# Patient Record
Sex: Male | Born: 1980 | Race: White | Hispanic: No | Marital: Married
Health system: Midwestern US, Community
[De-identification: ages and names within clinical notes are randomized; demographics above are authoritative.]

## PROBLEM LIST (undated history)

## (undated) DIAGNOSIS — F909 Attention-deficit hyperactivity disorder, unspecified type: Secondary | ICD-10-CM

## (undated) DIAGNOSIS — Z9889 Other specified postprocedural states: Secondary | ICD-10-CM

## (undated) DIAGNOSIS — K047 Periapical abscess without sinus: Secondary | ICD-10-CM

## (undated) DIAGNOSIS — J302 Other seasonal allergic rhinitis: Secondary | ICD-10-CM

## (undated) DIAGNOSIS — G4700 Insomnia, unspecified: Secondary | ICD-10-CM

## (undated) DIAGNOSIS — R112 Nausea with vomiting, unspecified: Secondary | ICD-10-CM

## (undated) HISTORY — PX: TONSILLECTOMY: SUR1361

## (undated) HISTORY — PX: MULTIPLE TOOTH EXTRACTIONS: SHX2053

## (undated) HISTORY — PX: WISDOM TOOTH EXTRACTION: SHX21

## (undated) HISTORY — PX: HERNIA REPAIR: SHX51

---

## 2015-04-06 ENCOUNTER — Other Ambulatory Visit: Payer: Self-pay | Admitting: Orthopaedic Surgery

## 2015-04-06 DIAGNOSIS — M25511 Pain in right shoulder: Secondary | ICD-10-CM

## 2015-04-21 ENCOUNTER — Ambulatory Visit
Admission: RE | Admit: 2015-04-21 | Discharge: 2015-04-21 | Disposition: A | Discharge status: Home / Self Care | Payer: BLUE CROSS/BLUE SHIELD | Source: Ambulatory Visit | Attending: Orthopaedic Surgery | Admitting: Orthopaedic Surgery

## 2015-04-21 DIAGNOSIS — M25511 Pain in right shoulder: Secondary | ICD-10-CM

## 2015-04-21 MED ORDER — IOHEXOL 180 MG/ML  SOLN
12.0000 mL | Freq: Once | INTRAMUSCULAR | Status: AC | PRN
  Administered 2015-04-21: 12 mL via INTRA_ARTICULAR

## 2015-08-02 ENCOUNTER — Ambulatory Visit
Admission: RE | Admit: 2015-08-02 | Discharge: 2015-08-02 | Disposition: A | Discharge status: Home / Self Care | Payer: No Typology Code available for payment source | Source: Ambulatory Visit | Attending: *Deleted | Admitting: *Deleted

## 2015-08-02 ENCOUNTER — Other Ambulatory Visit: Payer: Self-pay | Admitting: *Deleted

## 2015-08-02 DIAGNOSIS — R7611 Nonspecific reaction to tuberculin skin test without active tuberculosis: Secondary | ICD-10-CM

## 2016-01-15 DIAGNOSIS — Z6823 Body mass index (BMI) 23.0-23.9, adult: Secondary | ICD-10-CM | POA: Diagnosis not present

## 2016-01-15 DIAGNOSIS — Z23 Encounter for immunization: Secondary | ICD-10-CM | POA: Diagnosis not present

## 2016-01-15 DIAGNOSIS — Z1389 Encounter for screening for other disorder: Secondary | ICD-10-CM | POA: Diagnosis not present

## 2016-01-15 DIAGNOSIS — Z0000 Encounter for general adult medical examination without abnormal findings: Secondary | ICD-10-CM | POA: Diagnosis not present

## 2016-05-23 DIAGNOSIS — J018 Other acute sinusitis: Secondary | ICD-10-CM | POA: Diagnosis not present

## 2017-01-09 DIAGNOSIS — Z0000 Encounter for general adult medical examination without abnormal findings: Secondary | ICD-10-CM | POA: Diagnosis not present

## 2017-01-16 DIAGNOSIS — Z6822 Body mass index (BMI) 22.0-22.9, adult: Secondary | ICD-10-CM | POA: Diagnosis not present

## 2017-01-16 DIAGNOSIS — Z0000 Encounter for general adult medical examination without abnormal findings: Secondary | ICD-10-CM | POA: Diagnosis not present

## 2017-01-16 DIAGNOSIS — Z1389 Encounter for screening for other disorder: Secondary | ICD-10-CM | POA: Diagnosis not present

## 2017-06-20 DIAGNOSIS — J069 Acute upper respiratory infection, unspecified: Secondary | ICD-10-CM | POA: Diagnosis not present

## 2017-06-20 DIAGNOSIS — Z6823 Body mass index (BMI) 23.0-23.9, adult: Secondary | ICD-10-CM | POA: Diagnosis not present

## 2017-06-20 DIAGNOSIS — R05 Cough: Secondary | ICD-10-CM | POA: Diagnosis not present

## 2017-07-01 ENCOUNTER — Ambulatory Visit (INDEPENDENT_AMBULATORY_CARE_PROVIDER_SITE_OTHER): Payer: BLUE CROSS/BLUE SHIELD | Admitting: Orthopaedic Surgery

## 2017-07-01 ENCOUNTER — Encounter (INDEPENDENT_AMBULATORY_CARE_PROVIDER_SITE_OTHER): Payer: Self-pay | Admitting: Orthopaedic Surgery

## 2017-07-01 DIAGNOSIS — S43431D Superior glenoid labrum lesion of right shoulder, subsequent encounter: Secondary | ICD-10-CM

## 2017-07-01 NOTE — Progress Notes (Signed)
   Office Visit Note   Patient: Daniel Branch           Date of Birth: 06-12-1980           MRN: [id number] Visit Date: 07/01/2017              Requested by: No referring provider defined for this encounter. PCP: No primary care provider on file.   Assessment & Plan: Visit Diagnoses:  1. Labral tear of shoulder, right, subsequent encounter     Plan: MRI findings and the images from 2 years ago which does show a large lesion that extends into the inferior and posterior labrum.  Given the size of the labral tear and continued symptoms I recommend surgical treatment with Dr. August Saucerean.  Patient is in.  He will make an appointment with Dr. August Saucerean ASAP.  Follow-Up Instructions: Return for Dr. August Saucerean ASAP.   Orders:  No orders of the defined types were placed in this encounter.  No orders of the defined types were placed in this encounter.     Procedures: No procedures performed   Clinical Data: No additional findings.   Subjective: Chief Complaint  Patient presents with  . Right Shoulder - Pain, Follow-up    Daniel Branch comes in today for follow-up of his right shoulder labral tear.  I saw him 2 years ago for this issue.  He is trying to modify his activity but he continues to have pain and instability with his right shoulder.  He felt a recent subluxation moment when he had his arm in external rotation and abduction.  He is a Education officer, environmentalpastor at [employer] and he did have a brief struggle with substance abuse but now he is clean.    Review of Systems  Constitutional: Negative.   All other systems reviewed and are negative.    Objective: Vital Signs: There were no vitals taken for this visit.  Physical Exam  Constitutional: He is oriented to person, place, and time. He appears well-developed and well-nourished.  Pulmonary/Chest: Effort normal.  Abdominal: Soft.  Neurological: He is alert and oriented to person, place, and time.  Skin: Skin is warm.  Psychiatric: He has a normal mood  and affect. His behavior is normal. Judgment and thought content normal.  Nursing note and vitals reviewed.   Ortho Exam Right shoulder exam shows normal range of motion.  He does have a positive apprehension sign.  Rotator cuff is grossly intact.  He does have pain with loading of the posterior labrum. Specialty Comments:  No specialty comments available.  Imaging: No results found.   PMFS History: There are no active problems to display for this patient.  History reviewed. No pertinent past medical history.  History reviewed. No pertinent family history.  History reviewed. No pertinent surgical history. Social History   Occupational History  . Not on file  Tobacco Use  . Smoking status: Never Smoker  . Smokeless tobacco: Never Used  Substance and Sexual Activity  . Alcohol use: Not on file  . Drug use: Not on file  . Sexual activity: Not on file

## 2017-07-07 ENCOUNTER — Encounter (INDEPENDENT_AMBULATORY_CARE_PROVIDER_SITE_OTHER): Payer: Self-pay | Admitting: Orthopedic Surgery

## 2017-07-07 ENCOUNTER — Ambulatory Visit (INDEPENDENT_AMBULATORY_CARE_PROVIDER_SITE_OTHER): Payer: BLUE CROSS/BLUE SHIELD | Admitting: Orthopedic Surgery

## 2017-07-07 DIAGNOSIS — S43431D Superior glenoid labrum lesion of right shoulder, subsequent encounter: Secondary | ICD-10-CM | POA: Diagnosis not present

## 2017-07-07 NOTE — Progress Notes (Signed)
Office Visit Note   Patient: Daniel Branch           Date of Birth: November 10, 1980           MRN: [id number] Visit Date: 07/07/2017 Requested by: No referring provider defined for this encounter. PCP: Geoffry Paradise, MD  Subjective: Chief Complaint  Patient presents with  . Right Shoulder - Pain    HPI: Bhavesh is a patient with right shoulder pain and instability.  First episode of right shoulder instability occurred when he was playing ice hockey 15 years ago.  Shoulder slipped out and then went back again.  He has never had to go to the emergency room to have reduction performed.  This happens about 6 times a year.  Most recently it happened when he was sailing on a boat and an abduction external rotation force because the arm to slip out of socket again at the shoulder joint.  Is hard for him to sleep at times.  He has a 78-year-old son and a wife who will be giving birth due in August of this year.  He is left-hand dominant.  Notably the patient has also undergone treatment for substance abuse.  This has not related to opioids but has related to benzodiazepines and stimulants such as Adderall.  He did take opioids for hernia surgery and had no issues.              ROS: All systems reviewed are negative as they relate to the chief complaint within the history of present illness.  Patient denies  fevers or chills.   Assessment & Plan: Visit Diagnoses:  1. Labral tear of shoulder, right, subsequent encounter     Plan: Impression is anterior inferior labral tear of the right shoulder with current symptomatic instability.  Patient has social issues regarding trying to get this done before his wife gives birth to their second child.  He has had recurrent instability.  He does not have global laxity on examination.  He does not do very much in terms of overhead recreational activities with his nondominant right arm.  Plan is arthroscopy with arthroscopic labral repair of the anterior inferior  labrum.  Risk benefits are discussed including the trade off between losing some shoulder range of motion and gaining stability.  Patient's MRI scan is reviewed with the patient.  2017.  All questions answered.  Follow-Up Instructions: No follow-ups on file.   Orders:  No orders of the defined types were placed in this encounter.  No orders of the defined types were placed in this encounter.     Procedures: No procedures performed   Clinical Data: No additional findings.  Objective: Vital Signs: There were no vitals taken for this visit.  Physical Exam:   Constitutional: Patient appears well-developed HEENT:  Head: Normocephalic Eyes:EOM are normal Neck: Normal range of motion Cardiovascular: Normal rate Pulmonary/chest: Effort normal Neurologic: Patient is alert Skin: Skin is warm Psychiatric: Patient has normal mood and affect    Ortho Exam: Orthopedic exam demonstrates apprehension anteriorly on the right compared to the left.  Less than a centimeter sulcus sign bilaterally.  Rotator cuff strength intact bilaterally.  Negative O'Brien's testing on the right.  Does have more anterior laxity and posterior laxity on the right shoulder.  Patient's dislocation events almost always occur with abduction and external rotation.  Patient has no real instability with crossarm adduction and posterior directed force.  Specialty Comments:  No specialty comments available.  Imaging: No results found.  PMFS History: There are no active problems to display for this patient.  History reviewed. No pertinent past medical history.  History reviewed. No pertinent family history.  History reviewed. No pertinent surgical history. Social History   Occupational History  . Not on file  Tobacco Use  . Smoking status: Never Smoker  . Smokeless tobacco: Never Used  Substance and Sexual Activity  . Alcohol use: Not on file  . Drug use: Not on file  . Sexual activity: Not on file

## 2017-07-11 ENCOUNTER — Other Ambulatory Visit: Payer: Self-pay

## 2017-07-11 ENCOUNTER — Other Ambulatory Visit (INDEPENDENT_AMBULATORY_CARE_PROVIDER_SITE_OTHER): Payer: Self-pay | Admitting: Orthopedic Surgery

## 2017-07-11 ENCOUNTER — Encounter (HOSPITAL_COMMUNITY): Payer: Self-pay | Admitting: *Deleted

## 2017-07-11 DIAGNOSIS — S43431D Superior glenoid labrum lesion of right shoulder, subsequent encounter: Secondary | ICD-10-CM

## 2017-07-14 NOTE — H&P (Signed)
Daniel Branch is an 37 y.o. male.   Chief Complaint: Right shoulder instability HPI: Daniel Branch is a 37 year old patient with right shoulder instability.  Last event happened about a month ago when he was on a boat and he was in the abducted externally rotated position and the shoulder slipped out of joint.  Subsequent MRI scanning demonstrates anterior inferior labral tearing.  He was scheduled to have this fixed about 2 years ago but something came up and he had to postpone it.  He reports continued episodes of instability in the right shoulder with overhead types of activities.  His wife is due with their second child in August and he would like to have this addressed and resolved hopefully by the time the second child comes.  He does have a history of having some issues with stimulants and clonazepam type medication.  He has no history of opioid addiction.  Past Medical History:  Diagnosis Date  . ADHD   . Dental infection    treated with antibiotic  . Insomnia   . PONV (postoperative nausea and vomiting)   . Seasonal allergies     Past Surgical History:  Procedure Laterality Date  . HERNIA REPAIR    . MULTIPLE TOOTH EXTRACTIONS    . TONSILLECTOMY    . WISDOM TOOTH EXTRACTION      No family history on file. Social History:  reports that he has never smoked. He has never used smokeless tobacco. He reports that he drank alcohol. He reports that he does not use drugs.  Allergies:  Allergies  Allergen Reactions  . Cefaclor Hives    No medications prior to admission.    No results found for this or any previous visit (from the past 48 hour(s)). No results found.  Review of Systems  Musculoskeletal: Positive for joint pain.  All other systems reviewed and are negative.   There were no vitals taken for this visit. Physical Exam  Constitutional: He appears well-developed.  HENT:  Head: Normocephalic.  Eyes: Pupils are equal, round, and reactive to light.  Neck: Normal range  of motion.  Cardiovascular: Normal rate.  Respiratory: Effort normal.  Neurological: He is alert.  Skin: Skin is warm.  Psychiatric: He has a normal mood and affect.  Examination of the right shoulder demonstrates good rotator cuff strength and range of motion.  Does have a little bit of anterior apprehension with abduction and external rotation on the right which is not present on the left.  I do not detect any coarse grinding or crepitus with labral load testing.  Less than a centimeter sulcus sign bilaterally.  O'Brien's testing negative on the right negative on the left.  No discrete AC joint tenderness on the right-hand side.  Cervical spine range of motion is full.  Motor sensory function of the hand intact.  Assessment/Plan Impression is right shoulder instability of long duration.  MRI scan is consistent with anterior inferior labral tearing.  Fairly subtle but he has had recurrent episodes of instability.  Has a wife is giving birth to second child in August.  She would like to get this resolved before then.  I think based on his current activity level which does not include much in terms of overhead activities that he would do reasonably well with an arthroscopic stabilization procedure.  The risk and benefits are discussed including but not limited to infection nerve vessel damage as well as shoulder stiffness and incomplete restoration of full motion.  Patient understands risk  benefits and wishes to proceed.  All questions answered  Burnard Bunting, MD 07/14/2017, 3:22 PM

## 2017-07-15 ENCOUNTER — Encounter (INDEPENDENT_AMBULATORY_CARE_PROVIDER_SITE_OTHER): Payer: Self-pay | Admitting: Orthopedic Surgery

## 2017-07-15 ENCOUNTER — Telehealth (INDEPENDENT_AMBULATORY_CARE_PROVIDER_SITE_OTHER): Payer: Self-pay | Admitting: Orthopedic Surgery

## 2017-07-15 ENCOUNTER — Encounter (HOSPITAL_COMMUNITY)
Admission: RE | Disposition: A | Discharge status: Home / Self Care | Payer: Self-pay | Source: Ambulatory Visit | Attending: Orthopedic Surgery

## 2017-07-15 ENCOUNTER — Ambulatory Visit (HOSPITAL_COMMUNITY): Payer: BLUE CROSS/BLUE SHIELD | Admitting: Certified Registered Nurse Anesthetist

## 2017-07-15 ENCOUNTER — Encounter (HOSPITAL_COMMUNITY): Payer: Self-pay | Admitting: Certified Registered Nurse Anesthetist

## 2017-07-15 ENCOUNTER — Ambulatory Visit (HOSPITAL_COMMUNITY)
Admission: RE | Admit: 2017-07-15 | Discharge: 2017-07-15 | Disposition: A | Discharge status: Home / Self Care | Payer: BLUE CROSS/BLUE SHIELD | Source: Ambulatory Visit | Attending: Orthopedic Surgery | Admitting: Orthopedic Surgery

## 2017-07-15 DIAGNOSIS — Z888 Allergy status to other drugs, medicaments and biological substances status: Secondary | ICD-10-CM | POA: Diagnosis not present

## 2017-07-15 DIAGNOSIS — G8918 Other acute postprocedural pain: Secondary | ICD-10-CM | POA: Diagnosis not present

## 2017-07-15 DIAGNOSIS — S43431D Superior glenoid labrum lesion of right shoulder, subsequent encounter: Secondary | ICD-10-CM

## 2017-07-15 DIAGNOSIS — F909 Attention-deficit hyperactivity disorder, unspecified type: Secondary | ICD-10-CM | POA: Diagnosis not present

## 2017-07-15 DIAGNOSIS — S43431A Superior glenoid labrum lesion of right shoulder, initial encounter: Secondary | ICD-10-CM | POA: Diagnosis not present

## 2017-07-15 DIAGNOSIS — M25311 Other instability, right shoulder: Secondary | ICD-10-CM | POA: Insufficient documentation

## 2017-07-15 HISTORY — DX: Nausea with vomiting, unspecified: R11.2

## 2017-07-15 HISTORY — DX: Insomnia, unspecified: G47.00

## 2017-07-15 HISTORY — DX: Attention-deficit hyperactivity disorder, unspecified type: F90.9

## 2017-07-15 HISTORY — PX: SHOULDER ARTHROSCOPY WITH LABRAL REPAIR: SHX5691

## 2017-07-15 HISTORY — DX: Periapical abscess without sinus: K04.7

## 2017-07-15 HISTORY — DX: Other seasonal allergic rhinitis: J30.2

## 2017-07-15 HISTORY — DX: Other specified postprocedural states: Z98.890

## 2017-07-15 LAB — BASIC METABOLIC PANEL
ANION GAP: 7 (ref 5–15)
BUN: 18 mg/dL (ref 6–20)
CALCIUM: 9.5 mg/dL (ref 8.9–10.3)
CHLORIDE: 105 mmol/L (ref 101–111)
CO2: 25 mmol/L (ref 22–32)
Creatinine, Ser: 0.88 mg/dL (ref 0.61–1.24)
GFR calc non Af Amer: >60 mL/min (ref >60)
Glucose, Bld: 77 mg/dL (ref 65–99)
POTASSIUM: 3.7 mmol/L (ref 3.5–5.1)
Sodium: 137 mmol/L (ref 135–145)

## 2017-07-15 LAB — CBC
HEMATOCRIT: 44.8 % (ref 39.0–52.0)
HEMOGLOBIN: 15.1 g/dL (ref 13.0–17.0)
MCH: 31.2 pg (ref 26.0–34.0)
MCHC: 33.7 g/dL (ref 30.0–36.0)
MCV: 92.6 fL (ref 78.0–100.0)
Platelets: 215 10*3/uL (ref 150–400)
RBC: 4.84 MIL/uL (ref 4.22–5.81)
RDW: 12.8 % (ref 11.5–15.5)
WBC: 9.3 10*3/uL (ref 4.0–10.5)

## 2017-07-15 SURGERY — ARTHROSCOPY, SHOULDER, WITH GLENOID LABRUM REPAIR
Anesthesia: General | Laterality: Right

## 2017-07-15 MED ORDER — FENTANYL CITRATE (PF) 250 MCG/5ML IJ SOLN
INTRAMUSCULAR | Status: DC | PRN
  Administered 2017-07-15 (×2): 50 ug via INTRAVENOUS
  Administered 2017-07-15: 150 ug via INTRAVENOUS

## 2017-07-15 MED ORDER — LACTATED RINGERS IV SOLN
INTRAVENOUS | Status: DC | PRN
  Administered 2017-07-15 (×2): via INTRAVENOUS

## 2017-07-15 MED ORDER — DEXAMETHASONE SODIUM PHOSPHATE 10 MG/ML IJ SOLN
INTRAMUSCULAR | Status: AC
  Filled 2017-07-15: qty 1

## 2017-07-15 MED ORDER — KETOROLAC TROMETHAMINE 30 MG/ML IJ SOLN
INTRAMUSCULAR | Status: AC
  Filled 2017-07-15: qty 1

## 2017-07-15 MED ORDER — SODIUM CHLORIDE 0.9 % IJ SOLN
INTRAMUSCULAR | Status: DC | PRN
  Administered 2017-07-15: 50 mL

## 2017-07-15 MED ORDER — EPINEPHRINE PF 1 MG/ML IJ SOLN
INTRAMUSCULAR | Status: DC | PRN
  Administered 2017-07-15: 2.1 mL

## 2017-07-15 MED ORDER — FENTANYL CITRATE (PF) 100 MCG/2ML IJ SOLN
INTRAMUSCULAR | Status: AC
  Administered 2017-07-15: 100 ug via INTRAVENOUS
  Filled 2017-07-15: qty 2

## 2017-07-15 MED ORDER — FENTANYL CITRATE (PF) 100 MCG/2ML IJ SOLN
100.0000 ug | Freq: Once | INTRAMUSCULAR | Status: AC
  Administered 2017-07-15: 100 ug via INTRAVENOUS

## 2017-07-15 MED ORDER — MIDAZOLAM HCL 2 MG/2ML IJ SOLN
INTRAMUSCULAR | Status: AC
  Filled 2017-07-15: qty 2

## 2017-07-15 MED ORDER — LIDOCAINE HCL (CARDIAC) 20 MG/ML IV SOLN
INTRAVENOUS | Status: AC
  Filled 2017-07-15: qty 5

## 2017-07-15 MED ORDER — HYDROMORPHONE HCL 1 MG/ML IJ SOLN
INTRAMUSCULAR | Status: AC
  Administered 2017-07-15: 0.5 mg via INTRAVENOUS
  Filled 2017-07-15: qty 1

## 2017-07-15 MED ORDER — MIDAZOLAM HCL 5 MG/5ML IJ SOLN
INTRAMUSCULAR | Status: DC | PRN
  Administered 2017-07-15: 1 mg via INTRAVENOUS

## 2017-07-15 MED ORDER — PROPOFOL 10 MG/ML IV BOLUS
INTRAVENOUS | Status: DC | PRN
  Administered 2017-07-15: 110 mg via INTRAVENOUS

## 2017-07-15 MED ORDER — ROCURONIUM BROMIDE 10 MG/ML (PF) SYRINGE
PREFILLED_SYRINGE | INTRAVENOUS | Status: DC | PRN
  Administered 2017-07-15: 50 mg via INTRAVENOUS

## 2017-07-15 MED ORDER — SCOPOLAMINE 1 MG/3DAYS TD PT72
MEDICATED_PATCH | TRANSDERMAL | Status: DC | PRN
  Administered 2017-07-15: 1 via TRANSDERMAL

## 2017-07-15 MED ORDER — LIDOCAINE 2% (20 MG/ML) 5 ML SYRINGE
INTRAMUSCULAR | Status: DC | PRN
  Administered 2017-07-15: 100 mg via INTRAVENOUS

## 2017-07-15 MED ORDER — CHLORHEXIDINE GLUCONATE 4 % EX LIQD: 60.0000 mL | Freq: Once | CUTANEOUS | Status: DC

## 2017-07-15 MED ORDER — HYDROMORPHONE HCL 1 MG/ML IJ SOLN
0.2500 mg | INTRAMUSCULAR | Status: DC | PRN
  Administered 2017-07-15: 0.5 mg via INTRAVENOUS

## 2017-07-15 MED ORDER — MIDAZOLAM HCL 2 MG/2ML IJ SOLN
INTRAMUSCULAR | Status: AC
  Administered 2017-07-15: 2 mg via INTRAVENOUS
  Filled 2017-07-15: qty 2

## 2017-07-15 MED ORDER — KETOROLAC TROMETHAMINE 30 MG/ML IJ SOLN
INTRAMUSCULAR | Status: DC | PRN
  Administered 2017-07-15: 30 mg via INTRAVENOUS

## 2017-07-15 MED ORDER — SCOPOLAMINE 1 MG/3DAYS TD PT72
MEDICATED_PATCH | TRANSDERMAL | Status: AC
  Filled 2017-07-15: qty 1

## 2017-07-15 MED ORDER — ONDANSETRON HCL 4 MG/2ML IJ SOLN
INTRAMUSCULAR | Status: AC
  Filled 2017-07-15: qty 2

## 2017-07-15 MED ORDER — ONDANSETRON HCL 4 MG/2ML IJ SOLN
INTRAMUSCULAR | Status: DC | PRN
  Administered 2017-07-15: 4 mg via INTRAVENOUS

## 2017-07-15 MED ORDER — CLINDAMYCIN PHOSPHATE 900 MG/50ML IV SOLN
900.0000 mg | INTRAVENOUS | Status: AC
  Administered 2017-07-15: 900 mg via INTRAVENOUS

## 2017-07-15 MED ORDER — ROCURONIUM BROMIDE 10 MG/ML (PF) SYRINGE
PREFILLED_SYRINGE | INTRAVENOUS | Status: AC
  Filled 2017-07-15: qty 5

## 2017-07-15 MED ORDER — SUGAMMADEX SODIUM 200 MG/2ML IV SOLN
INTRAVENOUS | Status: DC | PRN
  Administered 2017-07-15: 200 mg via INTRAVENOUS

## 2017-07-15 MED ORDER — MIDAZOLAM HCL 2 MG/2ML IJ SOLN
2.0000 mg | Freq: Once | INTRAMUSCULAR | Status: AC
Start: 2017-07-15 — End: 2017-07-15
  Administered 2017-07-15: 2 mg via INTRAVENOUS

## 2017-07-15 MED ORDER — CLINDAMYCIN PHOSPHATE 900 MG/50ML IV SOLN
INTRAVENOUS | Status: AC
  Filled 2017-07-15: qty 50

## 2017-07-15 MED ORDER — SODIUM CHLORIDE 0.9 % IR SOLN
Status: DC | PRN
  Administered 2017-07-15: 6000 mL

## 2017-07-15 MED ORDER — BUPIVACAINE-EPINEPHRINE (PF) 0.5% -1:200000 IJ SOLN
INTRAMUSCULAR | Status: DC | PRN
  Administered 2017-07-15: 30 mL via PERINEURAL

## 2017-07-15 MED ORDER — DEXAMETHASONE SODIUM PHOSPHATE 10 MG/ML IJ SOLN
INTRAMUSCULAR | Status: DC | PRN
  Administered 2017-07-15: 5 mg via INTRAVENOUS

## 2017-07-15 MED ORDER — LACTATED RINGERS IV SOLN
INTRAVENOUS | Status: DC
  Administered 2017-07-15: 10:00:00 via INTRAVENOUS

## 2017-07-15 MED ORDER — SUGAMMADEX SODIUM 200 MG/2ML IV SOLN
INTRAVENOUS | Status: AC
  Filled 2017-07-15: qty 2

## 2017-07-15 MED ORDER — FENTANYL CITRATE (PF) 250 MCG/5ML IJ SOLN
INTRAMUSCULAR | Status: AC
Start: 2017-07-15 — End: 2017-07-15
  Filled 2017-07-15: qty 5

## 2017-07-15 MED ORDER — PROPOFOL 10 MG/ML IV BOLUS
INTRAVENOUS | Status: AC
  Filled 2017-07-15: qty 20

## 2017-07-15 MED ORDER — MEPERIDINE HCL 50 MG/ML IJ SOLN: 6.2500 mg | INTRAMUSCULAR | Status: DC | PRN

## 2017-07-15 MED ORDER — ONDANSETRON HCL 4 MG/2ML IJ SOLN
4.0000 mg | Freq: Once | INTRAMUSCULAR | Status: AC | PRN
  Administered 2017-07-15: 4 mg via INTRAVENOUS

## 2017-07-15 SURGICAL SUPPLY — 51 items
ANCHOR SUT BIOCOMP LK 2.9X12.5 (Anchor) ×8 IMPLANT
BLADE CUTTER GATOR 3.5 (BLADE) ×2 IMPLANT
BLADE GREAT WHITE 4.2 (BLADE) ×2 IMPLANT
BLADE SURG 11 STRL SS (BLADE) ×2 IMPLANT
CANNULA 5.75X71 LONG (CANNULA) ×2 IMPLANT
CANNULA TWIST IN 8.25X7CM (CANNULA) ×4 IMPLANT
COVER SURGICAL LIGHT HANDLE (MISCELLANEOUS) ×2 IMPLANT
DRAPE INCISE IOBAN 66X45 STRL (DRAPES) ×2 IMPLANT
DRAPE STERI 35X30 U-POUCH (DRAPES) ×4 IMPLANT
DRSG TEGADERM 4X4.75 (GAUZE/BANDAGES/DRESSINGS) ×2 IMPLANT
DURAPREP 26ML APPLICATOR (WOUND CARE) ×2 IMPLANT
ELECT REM PT RETURN 9FT ADLT (ELECTROSURGICAL) ×2
ELECTRODE REM PT RTRN 9FT ADLT (ELECTROSURGICAL) ×1 IMPLANT
FIBERSTICK 2 (SUTURE) IMPLANT
GAUZE SPONGE 4X4 12PLY STRL (GAUZE/BANDAGES/DRESSINGS) ×2 IMPLANT
GAUZE SPONGE 4X4 12PLY STRL LF (GAUZE/BANDAGES/DRESSINGS) ×2 IMPLANT
GAUZE XEROFORM 1X8 LF (GAUZE/BANDAGES/DRESSINGS) ×2 IMPLANT
GLOVE BIOGEL PI IND STRL 8 (GLOVE) ×1 IMPLANT
GLOVE BIOGEL PI INDICATOR 8 (GLOVE) ×1
GLOVE SURG ORTHO 8.0 STRL STRW (GLOVE) ×2 IMPLANT
GOWN STRL REUS W/ TWL LRG LVL3 (GOWN DISPOSABLE) ×3 IMPLANT
GOWN STRL REUS W/TWL LRG LVL3 (GOWN DISPOSABLE) ×3
KIT BASIN OR (CUSTOM PROCEDURE TRAY) ×2 IMPLANT
KIT DISPOSABLE PUSHLOCK 2.9MM (KITS) IMPLANT
KIT PUSHLOCK 2.9 HIP (KITS) ×2 IMPLANT
KIT TURNOVER KIT B (KITS) ×2 IMPLANT
LASSO 90 CVE QUICKPAS (DISPOSABLE) ×2 IMPLANT
LASSO CRESCENT QUICKPASS (SUTURE) IMPLANT
MANIFOLD NEPTUNE II (INSTRUMENTS) ×2 IMPLANT
NEEDLE SPNL 18GX3.5 QUINCKE PK (NEEDLE) ×2 IMPLANT
NEEDLE SUT 2-0 SCORPION KNEE (NEEDLE) ×2 IMPLANT
NS IRRIG 1000ML POUR BTL (IV SOLUTION) ×2 IMPLANT
PACK SHOULDER (CUSTOM PROCEDURE TRAY) ×2 IMPLANT
PAD ARMBOARD 7.5X6 YLW CONV (MISCELLANEOUS) ×4 IMPLANT
SET ARTHROSCOPY TUBING (MISCELLANEOUS) ×1
SET ARTHROSCOPY TUBING LN (MISCELLANEOUS) ×1 IMPLANT
SLING ARM IMMOBILIZER LRG (SOFTGOODS) ×2 IMPLANT
SLING ARM IMMOBILIZER MED (SOFTGOODS) IMPLANT
SPONGE LAP 4X18 X RAY DECT (DISPOSABLE) ×2 IMPLANT
SUT ETHILON 3 0 PS 1 (SUTURE) ×4 IMPLANT
SUT FIBERWIRE 2-0 18 17.9 3/8 (SUTURE)
SUT VIC AB 3-0 X1 27 (SUTURE) ×2 IMPLANT
SUTURE FIBERWR 2-0 18 17.9 3/8 (SUTURE) IMPLANT
SYR 20CC LL (SYRINGE) ×2 IMPLANT
SYR 30ML LL (SYRINGE) ×2 IMPLANT
TAPE LABRALWHITE 1.5X36 (TAPE) ×4 IMPLANT
TAPE SUT LABRALTAP WHT/BLK (SUTURE) ×4 IMPLANT
TOWEL OR 17X24 6PK STRL BLUE (TOWEL DISPOSABLE) ×2 IMPLANT
TOWEL OR 17X26 10 PK STRL BLUE (TOWEL DISPOSABLE) ×2 IMPLANT
WAND HAND CNTRL MULTIVAC 90 (MISCELLANEOUS) IMPLANT
WATER STERILE IRR 1000ML POUR (IV SOLUTION) ×2 IMPLANT

## 2017-07-15 NOTE — Transfer of Care (Signed)
Immediate Anesthesia Transfer of Care Note  Patient: Daniel Branch M Bungert  Procedure(s) Performed: RIGHT SHOULDER ARTHROSCOPY WITH LABRAL REPAIR (Right )  Patient Location: PACU  Anesthesia Type:General and Regional  Level of Consciousness: awake, alert  and oriented  Airway & Oxygen Therapy: Patient Spontanous Breathing  Post-op Assessment: Report given to RN, Post -op Vital signs reviewed and stable and Patient moving all extremities X 4  Post vital signs: Reviewed and stable  Last Vitals:  Vitals Value Taken Time  BP    Temp    Pulse 72 07/15/2017  1:10 PM  Resp 18 07/15/2017  1:10 PM  SpO2 98 % 07/15/2017  1:10 PM  Vitals shown include unvalidated device data.  Last Pain:  Vitals:   07/15/17 1025  PainSc: 0-No pain      Patients Stated Pain Goal: 1 (07/15/17 0947)  Complications: No apparent anesthesia complications

## 2017-07-15 NOTE — Telephone Encounter (Signed)
[facility] called needing to verify strength of Tramadol, rx written today. She always wanted to verify that the quantity was for 15. CB # [phone]

## 2017-07-15 NOTE — Interval H&P Note (Signed)
History and Physical Interval Note:  07/15/2017 9:57 AM  Daniel ShieldsNathan M Boyde  has presented today for surgery, with the diagnosis of Right Shoulder Labral Tear  The various methods of treatment have been discussed with the patient and family. After consideration of risks, benefits and other options for treatment, the patient has consented to  Procedure(s): RIGHT SHOULDER ARTHROSCOPY WITH LABRAL REPAIR (Right) as a surgical intervention .  The patient's history has been reviewed, patient examined, no change in status, stable for surgery.  I have reviewed the patient's chart and labs.  Questions were answered to the patient's satisfaction.     Burnard BuntingG Scott Jancie Kercher

## 2017-07-15 NOTE — Brief Op Note (Signed)
07/15/2017  1:12 PM  PATIENT:  Daniel Branch  37 y.o. male  PRE-OPERATIVE DIAGNOSIS:  Right Shoulder Labral Tear  POST-OPERATIVE DIAGNOSIS:  Right Shoulder Labral Tear  PROCEDURE:  Procedure(s): RIGHT SHOULDER ARTHROSCOPY WITH LABRAL REPAIR  SURGEON:  Surgeon(s): August Saucerean, Corrie MckusickGregory Scott, MD  ASSISTANT: Patrick Jupiterarla Bethune rnfa  ANESTHESIA:   general  EBL: 2 ml    Total I/O In: 1200 [I.V.:1200] Out: 20 [Blood:20]  BLOOD ADMINISTERED: none  DRAINS: none   LOCAL MEDICATIONS USED:  none  SPECIMEN:  No Specimen  COUNTS:  YES  TOURNIQUET:  * No tourniquets in log *  DICTATION: .Other Dictation: Dictation Number [id number]  PLAN OF CARE: Discharge to home after PACU  PATIENT DISPOSITION:  PACU - hemodynamically stable

## 2017-07-15 NOTE — Telephone Encounter (Signed)
Please advise. I do not see medication in patients chart. Thanks.

## 2017-07-15 NOTE — Anesthesia Preprocedure Evaluation (Addendum)
Anesthesia Evaluation  Patient identified by MRN, date of birth, ID band Patient awake    Reviewed: Allergy & Precautions, NPO status , Patient's Chart, lab work & pertinent test results  History of Anesthesia Complications (+) PONV  Airway Mallampati: I  TM Distance: >3 FB Neck ROM: Full    Dental  (+) Teeth Intact, Dental Advisory Given   Pulmonary    Pulmonary exam normal        Cardiovascular Normal cardiovascular exam     Neuro/Psych    GI/Hepatic   Endo/Other    Renal/GU      Musculoskeletal   Abdominal   Peds  Hematology   Anesthesia Other Findings   Reproductive/Obstetrics                            Anesthesia Physical Anesthesia Plan  ASA: II  Anesthesia Plan: General   Post-op Pain Management:  Regional for Post-op pain   Induction: Intravenous  PONV Risk Score and Plan: 2 and Ondansetron, Dexamethasone and Midazolam  Airway Management Planned: Oral ETT  Additional Equipment:   Intra-op Plan:   Post-operative Plan: Extubation in OR  Informed Consent: I have reviewed the patients History and Physical, chart, labs and discussed the procedure including the risks, benefits and alternatives for the proposed anesthesia with the patient or authorized representative who has indicated his/her understanding and acceptance.     Plan Discussed with: CRNA and Surgeon  Anesthesia Plan Comments:         Anesthesia Quick Evaluation

## 2017-07-15 NOTE — Anesthesia Procedure Notes (Signed)
Anesthesia Regional Block: Interscalene brachial plexus block   Pre-Anesthetic Checklist: ,, timeout performed, Correct Patient, Correct Site, Correct Laterality, Correct Procedure, Correct Position, site marked, Risks and benefits discussed,  Surgical consent,  Pre-op evaluation,  At surgeon's request and post-op pain management  Laterality: Right  Prep: chloraprep       Needles:  Injection technique: Single-shot  Needle Type: Other     Needle Length: 9cm  Needle Gauge: 21   Needle insertion depth: 5 cm   Additional Needles:   Procedures:, nerve stimulator,,,,,,,  Narrative:  Start time: 07/15/2017 10:12 AM End time: 07/15/2017 10:22 AM Injection made incrementally with aspirations every 5 mL.  Performed by: Personally  Anesthesiologist: Arta Brucessey, Kashmir Lysaght, MD  Additional Notes: Monitors applied. Patient sedated. Sterile prep and drape,hand hygiene and sterile gloves were used. Needle position confirmed with evoked response at 0.4 mV.Local anesthetic injected incrementally after negative aspiration.Vascular puncture avoided. No complications. The patient tolerated the procedure well.

## 2017-07-15 NOTE — Anesthesia Procedure Notes (Signed)
Procedure Name: Intubation Date/Time: 07/15/2017 10:54 AM Performed by: Nils PyleBell, Matraca Hunkins T, CRNA Pre-anesthesia Checklist: Patient identified, Emergency Drugs available, Suction available and Patient being monitored Patient Re-evaluated:Patient Re-evaluated prior to induction Oxygen Delivery Method: Circle System Utilized Preoxygenation: Pre-oxygenation with 100% oxygen Induction Type: IV induction Ventilation: Mask ventilation without difficulty Laryngoscope Size: Miller and 2 Grade View: Grade I Tube type: Oral Tube size: 7.5 mm Number of attempts: 1 Airway Equipment and Method: Stylet Placement Confirmation: ETT inserted through vocal cords under direct vision,  positive ETCO2 and breath sounds checked- equal and bilateral Secured at: 23 cm Tube secured with: Tape Dental Injury: Teeth and Oropharynx as per pre-operative assessment

## 2017-07-15 NOTE — Anesthesia Postprocedure Evaluation (Signed)
Anesthesia Post Note  Patient: Daniel Branch  Procedure(s) Performed: RIGHT SHOULDER ARTHROSCOPY WITH LABRAL REPAIR (Right )     Patient location during evaluation: PACU Anesthesia Type: General and Regional Level of consciousness: awake and alert and oriented Pain management: pain level controlled Vital Signs Assessment: post-procedure vital signs reviewed and stable Respiratory status: spontaneous breathing, nonlabored ventilation and respiratory function stable Cardiovascular status: blood pressure returned to baseline and stable Postop Assessment: no apparent nausea or vomiting (nausea relieved) Anesthetic complications: no    Last Vitals:  Vitals:   07/15/17 1355 07/15/17 1356  BP:  123/83  Pulse:  72  Resp:  17  Temp: 36.6 C   SpO2:  96%    Last Pain:  Vitals:   07/15/17 1355  PainSc: 0-No pain                 Destyni Hoppel,E. Tequia Wolman

## 2017-07-16 ENCOUNTER — Encounter (HOSPITAL_COMMUNITY): Payer: Self-pay | Admitting: Orthopedic Surgery

## 2017-07-16 NOTE — Op Note (Signed)
NAME:  Daniel Branch, Daniel Branch               ACCOUNT NO.:  [id number]  MEDICAL RECORD NO.:  [id number]  LOCATION:                                 FACILITY:  PHYSICIAN:  Burnard BuntingG. Scott Dean, M.D.         DATE OF BIRTH:  DATE OF PROCEDURE:  07/15/2017 DATE OF DISCHARGE:                              OPERATIVE REPORT   PREOPERATIVE DIAGNOSIS:  Right shoulder instability.  POSTOPERATIVE DIAGNOSIS:  Right shoulder instability.  PROCEDURE:  Right shoulder anterior labral repair.  SURGEON:  Burnard BuntingG. Scott Dean, M.D.  ASSISTANT:  Patrick Jupiterarla Bethune, RNFA.  INDICATION:  Daniel Branch is a 37 year old patient with right shoulder pain and instability.  He has had multiple episodes of instability over several years.  He has failed conservative management.  MRI scan shows labral tearing in the right shoulder from about 2-6 o'clock.  OPERATIVE FINDINGS: 1. Examination under anesthesia:  Range of motion, the patient had     about 75 degrees of external rotation and 15 degrees abduction.  2+     anterior instability with less than a centimeter of sulcus sign and     1+ posterior instability.  The patient had full forward flexion and     isolated glenohumeral abduction. 2. Diagnostic and operative arthroscopy:     a.     Tear of the anterior inferior labrum from the 2 o'clock to      the 4 o'clock position.     b.     Rotator cuff intact.     c.     Posterior labrum and superior labrum intact.     d.     Intact glenohumeral articular surfaces, except for some      scuffing of the anterior glenoid, which was grade 1-2 over a small      rim of surface area.  PROCEDURE IN DETAIL:  The patient was brought to the operating room where general anesthetic was induced.  Preoperative antibiotics administered.  Time-out was called.  Right shoulder examined under anesthesia and found to have anterior instability.  The patient was placed in lateral position with left peroneal nerve and left axilla well padded.  Arm was suspended in  10 degrees of forward flexion, 45 degrees of abduction with 15 pounds of traction.  Right arm, shoulder, and hand prescrubbed with alcohol Betadine before this and prepped with DuraPrep solution in a sterile manner.  Collier Flowersoban was used to cover the axilla.  Time- out was called.  Posterior portal created 2 cm medial and inferior to the posterolateral margin of the acromion.  Diagnostic arthroscopy was performed.  Anterior portal was created under direct visualization. Diagnostic arthroscopy demonstrated anterior-inferior labral tearing from the 2 o'clock to 4 o'clock position.  There was a little bit of scuffing of the glenohumeral surface anteriorly and inferiorly.  Rotator cuff was intact in the posterior labrum, and biceps anchor was stable. At this time, the soft tissue labral sleeve was mobilized with a periosteal elevator.  This allowed freer mobility and more proximal mobilization.  The glenoid rim was then prepared using a shaver and a Mace rasp.  The scorpion was then placed, and the labrum was  grasped about not more than 5 mm from the articular surface.  This was done at the 6 o'clock, 5 o'clock, 4 o'clock, and 2:30 position.  Beginning inferiorly and moving proximally, FiberTapes were placed along with 4 Arthrex 2.9 mm PushLocks.  This gave nice restoration of the bumper and nice restoration of the anterior-inferior glenohumeral ligament.  The suture tapes were cut.  Care was taken to avoid incorporating the middle glenohumeral ligament into the repair.  After solid bumper had been restored with a slight superior shift, the shoulder joint was thoroughly irrigated.  Instruments were removed from the portals.  The portals were then closed using 2-0 Vicryl and 3-0 nylon.  The patient tolerated the procedure well without immediate complication.  Waterproof dressings were placed.  The patient was transferred to the recovery room in stable condition.  The patient had improved shoulder  stability with about 45-50 degrees of external rotation at the conclusion.     Burnard Bunting, M.D.   ______________________________ Reece Agar. Dorene Grebe, M.D.    GSD/MEDQ  D:  07/15/2017  T:  07/15/2017  Job:  [id number]

## 2017-07-16 NOTE — Telephone Encounter (Signed)
IC verified with pharmacy.

## 2017-07-16 NOTE — Telephone Encounter (Signed)
10 mg tablets for 5 days # 15 1 tid

## 2017-07-24 ENCOUNTER — Encounter (INDEPENDENT_AMBULATORY_CARE_PROVIDER_SITE_OTHER): Payer: Self-pay | Admitting: Orthopedic Surgery

## 2017-07-24 ENCOUNTER — Ambulatory Visit (INDEPENDENT_AMBULATORY_CARE_PROVIDER_SITE_OTHER): Payer: BLUE CROSS/BLUE SHIELD | Admitting: Orthopedic Surgery

## 2017-07-24 DIAGNOSIS — S43431D Superior glenoid labrum lesion of right shoulder, subsequent encounter: Secondary | ICD-10-CM

## 2017-07-24 NOTE — Progress Notes (Signed)
   Post-Op Visit Note   Patient: Daniel Branch           Date of Birth: 1980-12-18           MRN: [id number] Visit Date: 07/24/2017 PCP: Daniel ParadiseAronson, Richard, MD   Assessment & Plan:  Chief Complaint:  Chief Complaint  Patient presents with  . Right Shoulder - Routine Post Op   Visit Diagnoses:  1. Labral tear of shoulder, right, subsequent encounter     Plan: Daniel Branch is a patient is a week out from right shoulder scope and labral repair for anterior instability.  Taking tramadol at night.  Still is having a little difficulty sleeping.  On examination deltoid fires.  Portal incisions intact and sutures removed.  He is got predictably stiff but not frozen shoulder.  He is got pretty good passive external rotation to about 30 degrees and abduction without any coarseness or grinding in the joint.  Plan at this time is to continue sling immobilization until he reaches that 3-week postop mark.  At that time I would like for him to start physical therapy for pendulum exercises and range of motion exercises external rotation of 30 abduction to 90 and forward flexion to 90 but not beyond.  At the 6-week mark we will add overhead motion.  For now stay in the sling.  Also continue sling use after that 3-week time mark where he is starting to do some range of motion exercises.  I will see him back in 4 weeks.  Follow-Up Instructions: Return in about 1 month (around 08/21/2017).   Orders:  No orders of the defined types were placed in this encounter.  No orders of the defined types were placed in this encounter.   Imaging: No results found.  PMFS History: There are no active problems to display for this patient.  Past Medical History:  Diagnosis Date  . ADHD   . Dental infection    treated with antibiotic  . Insomnia   . PONV (postoperative nausea and vomiting)   . Seasonal allergies     History reviewed. No pertinent family history.  Past Surgical History:  Procedure Laterality Date  .  HERNIA REPAIR    . MULTIPLE TOOTH EXTRACTIONS    . SHOULDER ARTHROSCOPY WITH LABRAL REPAIR Right 07/15/2017   Procedure: RIGHT SHOULDER ARTHROSCOPY WITH LABRAL REPAIR;  Surgeon: Cammy Copaean, Kiyah Demartini Scott, MD;  [facility];  Service: Orthopedics;  Laterality: Right;  . TONSILLECTOMY    . WISDOM TOOTH EXTRACTION     Social History   Occupational History  . Not on file  Tobacco Use  . Smoking status: Never Smoker  . Smokeless tobacco: Never Used  Substance and Sexual Activity  . Alcohol use: Not Currently  . Drug use: Never  . Sexual activity: Not on file

## 2017-07-25 NOTE — Addendum Note (Signed)
Addended byPrescott Parma: Ovetta Bazzano on: 07/25/2017 11:20 AM   Modules accepted: Orders

## 2017-08-05 DIAGNOSIS — M25511 Pain in right shoulder: Secondary | ICD-10-CM | POA: Diagnosis not present

## 2017-08-13 DIAGNOSIS — M25511 Pain in right shoulder: Secondary | ICD-10-CM | POA: Diagnosis not present

## 2017-08-25 ENCOUNTER — Ambulatory Visit (INDEPENDENT_AMBULATORY_CARE_PROVIDER_SITE_OTHER): Payer: BLUE CROSS/BLUE SHIELD | Admitting: Orthopedic Surgery

## 2017-08-25 ENCOUNTER — Encounter (INDEPENDENT_AMBULATORY_CARE_PROVIDER_SITE_OTHER): Payer: Self-pay | Admitting: Orthopedic Surgery

## 2017-08-25 DIAGNOSIS — S43431D Superior glenoid labrum lesion of right shoulder, subsequent encounter: Secondary | ICD-10-CM

## 2017-08-26 ENCOUNTER — Encounter (INDEPENDENT_AMBULATORY_CARE_PROVIDER_SITE_OTHER): Payer: Self-pay | Admitting: Orthopedic Surgery

## 2017-08-26 NOTE — Progress Notes (Signed)
   Post-Op Visit Note   Patient: Daniel Branch           Date of Birth: [birth year]           MRN: [id number] Visit Date: 08/25/2017 PCP: Geoffry Paradise, MD   Assessment & Plan:  Chief Complaint:  Chief Complaint  Patient presents with  . Right Shoulder - Routine Post Op   Visit Diagnoses:  1. Labral tear of shoulder, right, subsequent encounter     Plan: Jacey is now 6 weeks out right shoulder arthroscopy and labral repair.  On examination he has excellent range of motion still about 10 to 15 degrees less external rotation on the right compared to the left which is right where I want him to be.  Shoulder stability feels good to anterior and inferior stress testing.  Cuff strength is good.  Plan is to take him out of the sling.  No lifting more than 5 pounds.  Continue with therapy and initiate strengthening as well as overhead motion.  I do want him to avoid abduction and external rotation.  Come back in 6 weeks for final check and release.  Follow-Up Instructions: Return in about 6 weeks (around 10/06/2017).   Orders:  No orders of the defined types were placed in this encounter.  No orders of the defined types were placed in this encounter.   Imaging: No results found.  PMFS History: There are no active problems to display for this patient.  Past Medical History:  Diagnosis Date  . ADHD   . Dental infection    treated with antibiotic  . Insomnia   . PONV (postoperative nausea and vomiting)   . Seasonal allergies     History reviewed. No pertinent family history.  Past Surgical History:  Procedure Laterality Date  . HERNIA REPAIR    . MULTIPLE TOOTH EXTRACTIONS    . SHOULDER ARTHROSCOPY WITH LABRAL REPAIR Right 07/15/2017   Procedure: RIGHT SHOULDER ARTHROSCOPY WITH LABRAL REPAIR;  Surgeon: Cammy Copa, MD;  Location: [facility] OR;  Service: Orthopedics;  Laterality: Right;  . TONSILLECTOMY    . WISDOM TOOTH EXTRACTION     Social History   Occupational  History  . Not on file  Tobacco Use  . Smoking status: Never Smoker  . Smokeless tobacco: Never Used  Substance and Sexual Activity  . Alcohol use: Not Currently  . Drug use: Never  . Sexual activity: Not on file

## 2017-08-27 DIAGNOSIS — Z6823 Body mass index (BMI) 23.0-23.9, adult: Secondary | ICD-10-CM | POA: Diagnosis not present

## 2017-08-27 DIAGNOSIS — M25511 Pain in right shoulder: Secondary | ICD-10-CM | POA: Diagnosis not present

## 2017-08-27 DIAGNOSIS — S0093XA Contusion of unspecified part of head, initial encounter: Secondary | ICD-10-CM | POA: Diagnosis not present

## 2017-09-17 DIAGNOSIS — M25511 Pain in right shoulder: Secondary | ICD-10-CM | POA: Diagnosis not present

## 2017-10-06 ENCOUNTER — Ambulatory Visit (INDEPENDENT_AMBULATORY_CARE_PROVIDER_SITE_OTHER): Payer: BLUE CROSS/BLUE SHIELD | Admitting: Orthopedic Surgery

## 2017-10-06 ENCOUNTER — Encounter (INDEPENDENT_AMBULATORY_CARE_PROVIDER_SITE_OTHER): Payer: Self-pay | Admitting: Orthopedic Surgery

## 2017-10-06 DIAGNOSIS — S43431D Superior glenoid labrum lesion of right shoulder, subsequent encounter: Secondary | ICD-10-CM

## 2017-10-08 ENCOUNTER — Encounter (INDEPENDENT_AMBULATORY_CARE_PROVIDER_SITE_OTHER): Payer: Self-pay | Admitting: Orthopedic Surgery

## 2017-10-08 NOTE — Progress Notes (Signed)
   Post-Op Visit Note   Patient: Daniel Branch           Date of Birth: [birth year]           MRN: [id number] Visit Date: 10/06/2017 PCP: Geoffry ParadiseAronson, Richard, MD   Assessment & Plan:  Chief Complaint:  Chief Complaint  Patient presents with  . Right Shoulder - Follow-up   Visit Diagnoses:  1. Labral tear of shoulder, right, subsequent encounter     Plan: Daniel Branch is a patient is now almost 3 months out labral repair.  He is doing well.  His motion is improving.  He has minimal pain.  He has been in the pool.  He is doing high planks and scapular strengthening exercises and physical therapy.  Child is due August 1.  He also is going to the gym about 3 times a week.  On examination he has excellent range of motion and strength.  He is about 10 degrees less external rotation on the right compared to the left.  Very smooth range of motion is palpable in the right shoulder.  Patient is doing well from his labral repair.  Cautioned him to avoid abduction and external rotation.  I will see him back as needed.  Shoulder is stable.  Follow-Up Instructions: Return if symptoms worsen or fail to improve.   Orders:  No orders of the defined types were placed in this encounter.  No orders of the defined types were placed in this encounter.   Imaging: No results found.  PMFS History: There are no active problems to display for this patient.  Past Medical History:  Diagnosis Date  . ADHD   . Dental infection    treated with antibiotic  . Insomnia   . PONV (postoperative nausea and vomiting)   . Seasonal allergies     History reviewed. No pertinent family history.  Past Surgical History:  Procedure Laterality Date  . HERNIA REPAIR    . MULTIPLE TOOTH EXTRACTIONS    . SHOULDER ARTHROSCOPY WITH LABRAL REPAIR Right 07/15/2017   Procedure: RIGHT SHOULDER ARTHROSCOPY WITH LABRAL REPAIR;  Surgeon: Cammy Copaean, Harvey Lingo Scott, MD;  [facility];  Service: Orthopedics;  Laterality: Right;  .  TONSILLECTOMY    . WISDOM TOOTH EXTRACTION     Social History   Occupational History  . Not on file  Tobacco Use  . Smoking status: Never Smoker  . Smokeless tobacco: Never Used  Substance and Sexual Activity  . Alcohol use: Not Currently  . Drug use: Never  . Sexual activity: Not on file

## 2017-10-13 DIAGNOSIS — M25511 Pain in right shoulder: Secondary | ICD-10-CM | POA: Diagnosis not present

## 2017-12-22 ENCOUNTER — Encounter (INDEPENDENT_AMBULATORY_CARE_PROVIDER_SITE_OTHER): Payer: Self-pay | Admitting: Orthopedic Surgery

## 2017-12-22 ENCOUNTER — Ambulatory Visit (INDEPENDENT_AMBULATORY_CARE_PROVIDER_SITE_OTHER): Payer: BLUE CROSS/BLUE SHIELD | Admitting: Orthopedic Surgery

## 2017-12-22 ENCOUNTER — Ambulatory Visit (INDEPENDENT_AMBULATORY_CARE_PROVIDER_SITE_OTHER): Payer: Self-pay

## 2017-12-22 DIAGNOSIS — M25511 Pain in right shoulder: Secondary | ICD-10-CM

## 2017-12-22 NOTE — Progress Notes (Signed)
Office Visit Note   Patient: Daniel Branch           Date of Birth: 06-03-1980           MRN: [id number] Visit Date: 12/22/2017 Requested by: Geoffry Paradise, MD [address] [place], [place] [address] PCP: Geoffry Paradise, MD  Subjective: Chief Complaint  Patient presents with  . Right Shoulder - Pain    HPI: Daniel Branch is a patient who is now about 4 months out right shoulder stabilization.  He had been doing well until he did some type of maneuver with significant extension and then flexion of the shoulder.  This is not a loadbearing or contact injury.'s been having some acute bruise like pain in the anterior aspect of the shoulder since that time.  He has a new child at home named Daniel Branch.  He states that with this injury he "got a numb feeling in his shoulder".  Denies any neck pain.  The pain does not wake him from sleep.  Does not report any new mechanical symptoms.              ROS: All systems reviewed are negative as they relate to the chief complaint within the history of present illness.  Patient denies  fevers or chills.   Assessment & Plan: Visit Diagnoses:  1. Acute pain of right shoulder     Plan: Impression is right shoulder pain anteriorly with good stability in the shoulder.  Do not detect any mechanical symptoms or coarseness or grinding.  Hard to know exactly what happened but his mechanism of injury is consistent with potential stretching of that capsular repair.  I do not think that the capsule repair has structurally been altered based on his stability examination today.  For 2 weeks of symptoms I went up to try him with some topical and continue with oral anti-inflammatories normal radiographs of the shoulder obtained today.  4-week return to decide for or against any type of intervention.  May have to do repeat MRI scanning if his symptoms persist.  Follow-Up Instructions: No follow-ups on file.   Orders:  Orders Placed This Encounter  Procedures  . XR  Shoulder Right   No orders of the defined types were placed in this encounter.     Procedures: No procedures performed   Clinical Data: No additional findings.  Objective: Vital Signs: There were no vitals taken for this visit.  Physical Exam:   Constitutional: Patient appears well-developed HEENT:  Head: Normocephalic Eyes:EOM are normal Neck: Normal range of motion Cardiovascular: Normal rate Pulmonary/chest: Effort normal Neurologic: Patient is alert Skin: Skin is warm Psychiatric: Patient has normal mood and affect    Ortho Exam: Ortho exam demonstrates excellent range of motion of the right shoulder with still about 10 to 15 degrees less external rotation on the right than the left.  This is the way it was at his last clinic visit.  Patient has no anterior apprehension.  Shoulder stability feels good with 1+ anterior stability testing with solid endpoint both at 90 degrees of abduction external rotation along with no abduction.  Motor or sensory to the shoulder is intact and there is no coarse grinding or crepitus with passive range of motion of the shoulder.  Specialty Comments:  No specialty comments available.  Imaging: No results found.   PMFS History: There are no active problems to display for this patient.  Past Medical History:  Diagnosis Date  . ADHD   . Dental infection  treated with antibiotic  . Insomnia   . PONV (postoperative nausea and vomiting)   . Seasonal allergies     History reviewed. No pertinent family history.  Past Surgical History:  Procedure Laterality Date  . HERNIA REPAIR    . MULTIPLE TOOTH EXTRACTIONS    . SHOULDER ARTHROSCOPY WITH LABRAL REPAIR Right 07/15/2017   Procedure: RIGHT SHOULDER ARTHROSCOPY WITH LABRAL REPAIR;  Surgeon: Cammy Copa, MD;  Location: [facility] OR;  Service: Orthopedics;  Laterality: Right;  . TONSILLECTOMY    . WISDOM TOOTH EXTRACTION     Social History   Occupational History  . Not on file    Tobacco Use  . Smoking status: Never Smoker  . Smokeless tobacco: Never Used  Substance and Sexual Activity  . Alcohol use: Not Currently  . Drug use: Never  . Sexual activity: Not on file

## 2017-12-22 NOTE — Progress Notes (Signed)
x

## 2018-01-07 DIAGNOSIS — Z3009 Encounter for other general counseling and advice on contraception: Secondary | ICD-10-CM | POA: Diagnosis not present

## 2018-01-19 ENCOUNTER — Ambulatory Visit (INDEPENDENT_AMBULATORY_CARE_PROVIDER_SITE_OTHER): Payer: BLUE CROSS/BLUE SHIELD | Admitting: Orthopedic Surgery

## 2018-01-19 ENCOUNTER — Encounter (INDEPENDENT_AMBULATORY_CARE_PROVIDER_SITE_OTHER): Payer: Self-pay | Admitting: Orthopedic Surgery

## 2018-01-19 DIAGNOSIS — M25511 Pain in right shoulder: Secondary | ICD-10-CM

## 2018-01-19 DIAGNOSIS — Z0000 Encounter for general adult medical examination without abnormal findings: Secondary | ICD-10-CM | POA: Diagnosis not present

## 2018-01-21 ENCOUNTER — Encounter (INDEPENDENT_AMBULATORY_CARE_PROVIDER_SITE_OTHER): Payer: Self-pay | Admitting: Orthopedic Surgery

## 2018-01-21 NOTE — Progress Notes (Signed)
Office Visit Note   Patient: Daniel Branch           Date of Birth: [birth year]           MRN: [id number] Visit Date: 01/19/2018 Requested by: Geoffry Paradise, MD [address] [place], [place] [address] PCP: Geoffry Paradise, MD  Subjective: Chief Complaint  Patient presents with  . Right Shoulder - Follow-up    HPI: [name] is a patient who has a 6-week history of right shoulder pain.  He was asymptomatic following arthroscopic Bankart repair.  Currently he is having symptoms in the right shoulder following an extension injury 6 weeks ago.  Its a dull ache on the front and top of the shoulder.  Is not really activity related.  He denies any history of instability.  It is not a sharp stabbing pain but more of a dull aching pain.  Pain does not wake him from sleep at night.  He does take ibuprofen.  In general it has not improved over the past 4 weeks.              ROS: All systems reviewed are negative as they relate to the chief complaint within the history of present illness.  Patient denies  fevers or chills.   Assessment & Plan: Visit Diagnoses:  1. Acute pain of right shoulder   2. Right shoulder pain, unspecified chronicity     Plan: Impression is right shoulder pain with anterior symptoms may be related to the biceps tendon.  Shoulder itself feels stable.  It does hurt him to do push-ups.  I think this could be AC joint related as well.  Plan at this time MRI arthrogram to evaluate anterior and superior shoulder pain following injury after arthroscopic Bankart repair.  I will see him back after that study  Follow-Up Instructions: Return for after MRI.   Orders:  Orders Placed This Encounter  Procedures  . MR SHOULDER RIGHT W CONTRAST  . Arthrogram   No orders of the defined types were placed in this encounter.     Procedures: No procedures performed   Clinical Data: No additional findings.  Objective: Vital Signs: There were no vitals taken for this  visit.  Physical Exam:   Constitutional: Patient appears well-developed HEENT:  Head: Normocephalic Eyes:EOM are normal Neck: Normal range of motion Cardiovascular: Normal rate Pulmonary/chest: Effort normal Neurologic: Patient is alert Skin: Skin is warm Psychiatric: Patient has normal mood and affect    Ortho Exam: Ortho exam demonstrates excellent range of motion of the right shoulder with mild AC joint tenderness on the right compared to the left.  No real instability.  Still has slightly less external rotation on the right than the left which I think indicates good patency of the repair.  Impingement signs equivocal.  O'Brien's testing negative.  Specialty Comments:  No specialty comments available.  Imaging: No results found.   PMFS History: There are no active problems to display for this patient.  Past Medical History:  Diagnosis Date  . ADHD   . Dental infection    treated with antibiotic  . Insomnia   . PONV (postoperative nausea and vomiting)   . Seasonal allergies     History reviewed. No pertinent family history.  Past Surgical History:  Procedure Laterality Date  . HERNIA REPAIR    . MULTIPLE TOOTH EXTRACTIONS    . SHOULDER ARTHROSCOPY WITH LABRAL REPAIR Right 07/15/2017   Procedure: RIGHT SHOULDER ARTHROSCOPY WITH LABRAL REPAIR;  Surgeon: August Saucer,  Corrie Mckusick, MD;  Location: [facility] OR;  Service: Orthopedics;  Laterality: Right;  . TONSILLECTOMY    . WISDOM TOOTH EXTRACTION     Social History   Occupational History  . Not on file  Tobacco Use  . Smoking status: Never Smoker  . Smokeless tobacco: Never Used  Substance and Sexual Activity  . Alcohol use: Not Currently  . Drug use: Never  . Sexual activity: Not on file

## 2018-01-22 DIAGNOSIS — Z6823 Body mass index (BMI) 23.0-23.9, adult: Secondary | ICD-10-CM | POA: Diagnosis not present

## 2018-01-22 DIAGNOSIS — Z0000 Encounter for general adult medical examination without abnormal findings: Secondary | ICD-10-CM | POA: Diagnosis not present

## 2018-01-22 DIAGNOSIS — Z1389 Encounter for screening for other disorder: Secondary | ICD-10-CM | POA: Diagnosis not present

## 2018-02-03 ENCOUNTER — Ambulatory Visit
Admission: RE | Admit: 2018-02-03 | Discharge: 2018-02-03 | Disposition: A | Discharge status: Home / Self Care | Payer: BLUE CROSS/BLUE SHIELD | Source: Ambulatory Visit | Attending: Orthopedic Surgery | Admitting: Orthopedic Surgery

## 2018-02-03 DIAGNOSIS — Z302 Encounter for sterilization: Secondary | ICD-10-CM | POA: Diagnosis not present

## 2018-02-03 DIAGNOSIS — M25511 Pain in right shoulder: Secondary | ICD-10-CM | POA: Diagnosis not present

## 2018-02-03 MED ORDER — IOPAMIDOL (ISOVUE-M 200) INJECTION 41%
15.0000 mL | Freq: Once | INTRAMUSCULAR | Status: AC
  Administered 2018-02-03: 15 mL via INTRA_ARTICULAR

## 2018-02-06 ENCOUNTER — Ambulatory Visit (INDEPENDENT_AMBULATORY_CARE_PROVIDER_SITE_OTHER): Payer: BLUE CROSS/BLUE SHIELD | Admitting: Orthopedic Surgery

## 2018-02-06 ENCOUNTER — Encounter (INDEPENDENT_AMBULATORY_CARE_PROVIDER_SITE_OTHER): Payer: Self-pay | Admitting: Orthopedic Surgery

## 2018-02-06 DIAGNOSIS — M25511 Pain in right shoulder: Secondary | ICD-10-CM | POA: Diagnosis not present

## 2018-02-09 ENCOUNTER — Encounter (INDEPENDENT_AMBULATORY_CARE_PROVIDER_SITE_OTHER): Payer: Self-pay | Admitting: Orthopedic Surgery

## 2018-02-09 NOTE — Progress Notes (Signed)
Office Visit Note   Patient: Daniel Branch           Date of Birth: [birth year]           MRN: [id number] Visit Date: 02/06/2018 Requested by: Geoffry Paradise, MD [address] [place], [place] [address] PCP: Geoffry Paradise, MD  Subjective: Chief Complaint  Patient presents with  . Right Shoulder - Follow-up    HPI: Daniel Branch is a patient with right shoulder pain.  He is 6-1/2 months out from right shoulder stabilization.  Had a reinjury several months ago.  He is really having no stability issues but he is having some anterior pain.  Does hurt him to do push-ups.  Localizes the pain in the anterior aspect of the shoulder but not really around the [facility] joint.              ROS: All systems reviewed are negative as they relate to the chief complaint within the history of present illness.  Patient denies  fevers or chills.   Assessment & Plan: Visit Diagnoses:  1. Right shoulder pain, unspecified chronicity     Plan: Impression is intact appearing capsule labral repair with no episodes of recurrent instability but he has is having what sounds like intra-articular pain.  He does have a loose body in the subscap recess which is not really accessible arthroscopically.  I do not think that is necessarily causing his symptoms as it was there prior to his surgery for this location.  I think he does have an intra-articular pain generator and thus it would be good to try an intra-articular shoulder joint injection.  I will see him back several months if he is not improving clinically.  Follow-Up Instructions: No follow-ups on file.   Orders:  Orders Placed This Encounter  Procedures  . Ambulatory referral to Physical Medicine Rehab   No orders of the defined types were placed in this encounter.     Procedures: No procedures performed   Clinical Data: No additional findings.  Objective: Vital Signs: There were no vitals taken for this visit.  Physical Exam:   Constitutional:  Patient appears well-developed HEENT:  Head: Normocephalic Eyes:EOM are normal Neck: Normal range of motion Cardiovascular: Normal rate Pulmonary/chest: Effort normal Neurologic: Patient is alert Skin: Skin is warm Psychiatric: Patient has normal mood and affect    Ortho Exam: Ortho exam demonstrates some tenderness to the coracoid process to palpation on the right.  He has excellent shoulder range of motion and pretty good stability to anterior testing.  No real apprehension and it feels like he is got a solid endpoint.  Less than a centimeter sulcus sign bilaterally.  Specialty Comments:  No specialty comments available.  Imaging: No results found.   PMFS History: There are no active problems to display for this patient.  Past Medical History:  Diagnosis Date  . ADHD   . Dental infection    treated with antibiotic  . Insomnia   . PONV (postoperative nausea and vomiting)   . Seasonal allergies     History reviewed. No pertinent family history.  Past Surgical History:  Procedure Laterality Date  . HERNIA REPAIR    . MULTIPLE TOOTH EXTRACTIONS    . SHOULDER ARTHROSCOPY WITH LABRAL REPAIR Right 07/15/2017   Procedure: RIGHT SHOULDER ARTHROSCOPY WITH LABRAL REPAIR;  Surgeon: Cammy Copa, MD;  Location: [facility] OR;  Service: Orthopedics;  Laterality: Right;  . TONSILLECTOMY    . WISDOM TOOTH EXTRACTION  Social History   Occupational History  . Not on file  Tobacco Use  . Smoking status: Never Smoker  . Smokeless tobacco: Never Used  Substance and Sexual Activity  . Alcohol use: Not Currently  . Drug use: Never  . Sexual activity: Not on file

## 2018-02-19 ENCOUNTER — Encounter (INDEPENDENT_AMBULATORY_CARE_PROVIDER_SITE_OTHER): Payer: Self-pay | Admitting: Physical Medicine and Rehabilitation

## 2018-02-19 ENCOUNTER — Ambulatory Visit (INDEPENDENT_AMBULATORY_CARE_PROVIDER_SITE_OTHER): Payer: BLUE CROSS/BLUE SHIELD | Admitting: Physical Medicine and Rehabilitation

## 2018-02-19 ENCOUNTER — Ambulatory Visit (INDEPENDENT_AMBULATORY_CARE_PROVIDER_SITE_OTHER): Payer: Self-pay

## 2018-02-19 DIAGNOSIS — G8929 Other chronic pain: Secondary | ICD-10-CM

## 2018-02-19 DIAGNOSIS — M25511 Pain in right shoulder: Secondary | ICD-10-CM | POA: Diagnosis not present

## 2018-02-19 NOTE — Progress Notes (Signed)
 .  Numeric Pain Rating Scale and Functional Assessment Average Pain 5   In the last MONTH (on 0-10 scale) has pain interfered with the following?  1. General activity like being  able to carry out your everyday physical activities such as walking, climbing stairs, carrying groceries, or moving a chair?  Rating(6)    -Dye Allergies.  

## 2018-02-19 NOTE — Progress Notes (Signed)
   [name] - 37 y.o. male MRN [id number]  Date of birth: [birth year]  Office Visit Note: Visit Date: 02/19/2018 PCP: Geoffry Paradise, MD Referred by: Geoffry Paradise, MD  Subjective: Chief Complaint  Patient presents with  . Right Shoulder - Pain   HPI:  Daniel Branch is a 37 y.o. male who comes in today At the request of G. Dorene Grebe for intra-articular right glenohumeral joint injection with fluoroscopic guidance.  Please see Dr. Diamantina Providence notes for further details and justification.  Patient is having chronic worsening recalcitrant shoulder pain despite surgery for labral repair in July.  Has exacerbations with lifting and lifting overhead in particular push-ups.  ROS Otherwise per HPI.  Assessment & Plan: Visit Diagnoses:  1. Chronic right shoulder pain     Plan: No additional findings.   Meds & Orders: No orders of the defined types were placed in this encounter.   Orders Placed This Encounter  Procedures  . Large Joint Inj: R glenohumeral  . XR C-ARM NO REPORT    Follow-up: Return for  Burnard Bunting, M.D..   Procedures: Large Joint Inj: R glenohumeral on 02/19/2018 8:52 AM Indications: pain and diagnostic evaluation Details: 22 G 3.5 in needle, fluoroscopy-guided anteromedial approach  Arthrogram: No  Medications: 80 mg triamcinolone acetonide 40 MG/ML; 3 mL bupivacaine 0.5 % Outcome: tolerated well, no immediate complications  There was excellent flow of contrast producing a partial arthrogram of the glenohumeral joint. The patient did have relief of symptoms during the anesthetic phase of the injection. Procedure, treatment alternatives, risks and benefits explained, specific risks discussed. Consent was given by the patient. Immediately prior to procedure a time out was called to verify the correct patient, procedure, equipment, support staff and site/side marked as required. Patient was prepped and draped in the usual sterile fashion.      No notes on  file   Clinical History: No specialty comments available.     Objective:  VS:  HT:    WT:   BMI:     BP:   HR: bpm  TEMP: ( )  RESP:  Physical Exam  Ortho Exam Imaging: No results found.

## 2018-03-06 MED ORDER — BUPIVACAINE HCL 0.5 % IJ SOLN
3.0000 mL | INTRAMUSCULAR | Status: AC | PRN
  Administered 2018-02-19: 3 mL via INTRA_ARTICULAR

## 2018-03-06 MED ORDER — TRIAMCINOLONE ACETONIDE 40 MG/ML IJ SUSP
80.0000 mg | INTRAMUSCULAR | Status: AC | PRN
  Administered 2018-02-19: 80 mg via INTRA_ARTICULAR

## 2018-03-30 ENCOUNTER — Telehealth (INDEPENDENT_AMBULATORY_CARE_PROVIDER_SITE_OTHER): Payer: Self-pay | Admitting: Physical Medicine and Rehabilitation

## 2018-03-30 NOTE — Telephone Encounter (Signed)
Toni Amendourtney, ok to sched, see Dr Diamantina Providenceean's note.

## 2018-03-30 NOTE — Telephone Encounter (Signed)
Ok for injection 12 21 or after

## 2018-03-30 NOTE — Telephone Encounter (Signed)
See below, please advise on a repeat right shoulder injection per [facility], patient request.

## 2018-03-30 NOTE — Telephone Encounter (Signed)
Please advise 

## 2018-03-30 NOTE — Telephone Encounter (Signed)
Best to run this by Dr. August Saucerean since patient was just here in November.

## 2018-03-31 NOTE — Telephone Encounter (Signed)
Scheduled for 04/21/17 at 0915.

## 2018-04-02 DIAGNOSIS — H5712 Ocular pain, left eye: Secondary | ICD-10-CM | POA: Diagnosis not present

## 2018-04-02 DIAGNOSIS — Z135 Encounter for screening for eye and ear disorders: Secondary | ICD-10-CM | POA: Diagnosis not present

## 2018-04-13 ENCOUNTER — Telehealth (INDEPENDENT_AMBULATORY_CARE_PROVIDER_SITE_OTHER): Payer: Self-pay | Admitting: Orthopedic Surgery

## 2018-04-13 MED ORDER — CYCLOBENZAPRINE HCL 10 MG PO TABS: 10.0000 mg | ORAL_TABLET | Freq: Every day | ORAL | 0 refills | Status: DC

## 2018-04-13 NOTE — Telephone Encounter (Signed)
Flexeril 10 mg one po q HS prn spasms.  # 20 tabs.  He also could rest his shoulder with sling for a few hrs. Which will help . Follow up with Dr. August Saucerean .

## 2018-04-13 NOTE — Telephone Encounter (Signed)
Patient called he is struggling with pain in his Right shoulder. Daniel Branch would like to know if he can  discuss options for pain management or a muscle relaxer. Patient states most of his pain is at the night time.

## 2018-04-13 NOTE — Telephone Encounter (Signed)
Sent to pharmacy. Patient advised.  

## 2018-04-13 NOTE — Telephone Encounter (Signed)
Can you advise on muscle relaxer or hold for Dr. August Saucerean?

## 2018-04-21 ENCOUNTER — Ambulatory Visit (INDEPENDENT_AMBULATORY_CARE_PROVIDER_SITE_OTHER): Payer: BLUE CROSS/BLUE SHIELD | Admitting: Physical Medicine and Rehabilitation

## 2018-04-21 ENCOUNTER — Ambulatory Visit (INDEPENDENT_AMBULATORY_CARE_PROVIDER_SITE_OTHER): Payer: Self-pay

## 2018-04-21 DIAGNOSIS — G8929 Other chronic pain: Secondary | ICD-10-CM | POA: Diagnosis not present

## 2018-04-21 DIAGNOSIS — M25511 Pain in right shoulder: Secondary | ICD-10-CM | POA: Diagnosis not present

## 2018-04-21 MED ORDER — TRIAMCINOLONE ACETONIDE 40 MG/ML IJ SUSP
80.0000 mg | INTRAMUSCULAR | Status: AC | PRN
  Administered 2018-04-21: 80 mg via INTRA_ARTICULAR

## 2018-04-21 MED ORDER — BUPIVACAINE HCL 0.5 % IJ SOLN
3.0000 mL | INTRAMUSCULAR | Status: AC | PRN
  Administered 2018-04-21: 3 mL via INTRA_ARTICULAR

## 2018-04-21 NOTE — Progress Notes (Signed)
Here for repeat right shoulder injection. Excellent relief from last injection. Relief lasted for about 2 months.  Numeric Pain Rating Scale and Functional Assessment Average Pain 5   In the last MONTH (on 0-10 scale) has pain interfered with the following?  1. General activity like being  able to carry out your everyday physical activities such as walking, climbing stairs, carrying groceries, or moving a chair?  Rating(3)    -Dye Allergies.

## 2018-04-21 NOTE — Patient Instructions (Signed)

## 2018-04-21 NOTE — Progress Notes (Signed)
   DAISHAWN SALAMA - 37 y.o. male MRN [id number]  Date of birth: 1980/06/30  Office Visit Note: Visit Date: 04/21/2018 PCP: Geoffry Paradise, MD Referred by: Geoffry Paradise, MD  Subjective: Chief Complaint  Patient presents with  . Right Shoulder - Pain   HPI:  BERVIN KRONINGER is a 38 y.o. male who comes in today For planned repeat intra-articular right glenohumeral joint injection fluoroscopic guidance.  Patient got really profound relief for almost 2 months and symptoms started to slowly come back.  He has not had any new injury.  We are going to repeat the injection today he will follow-up with Dr. August Saucer when his symptoms recur.  ROS Otherwise per HPI.  Assessment & Plan: Visit Diagnoses:  1. Chronic right shoulder pain     Plan: No additional findings.   Meds & Orders: No orders of the defined types were placed in this encounter.   Orders Placed This Encounter  Procedures  . Large Joint Inj: R glenohumeral  . XR C-ARM NO REPORT    Follow-up: Return if symptoms worsen or fail to improve.   Procedures: Large Joint Inj: R glenohumeral on 04/21/2018 9:22 AM Indications: pain and diagnostic evaluation Details: 22 G 3.5 in needle, fluoroscopy-guided anteromedial approach  Arthrogram: No  Medications: 80 mg triamcinolone acetonide 40 MG/ML; 3 mL bupivacaine 0.5 % Outcome: tolerated well, no immediate complications  There was excellent flow of contrast producing a partial arthrogram of the glenohumeral joint. The patient did have relief of symptoms during the anesthetic phase of the injection. Procedure, treatment alternatives, risks and benefits explained, specific risks discussed. Consent was given by the patient. Immediately prior to procedure a time out was called to verify the correct patient, procedure, equipment, support staff and site/side marked as required. Patient was prepped and draped in the usual sterile fashion.      No notes on file   Clinical History: No  specialty comments available.     Objective:  VS:  HT:    WT:   BMI:     BP:   HR: bpm  TEMP: ( )  RESP:  Physical Exam  Ortho Exam Imaging: No results found.

## 2018-05-06 ENCOUNTER — Ambulatory Visit (INDEPENDENT_AMBULATORY_CARE_PROVIDER_SITE_OTHER): Payer: BLUE CROSS/BLUE SHIELD | Admitting: Orthopedic Surgery

## 2018-05-06 ENCOUNTER — Encounter (INDEPENDENT_AMBULATORY_CARE_PROVIDER_SITE_OTHER): Payer: Self-pay | Admitting: Orthopedic Surgery

## 2018-05-06 VITALS — Ht 75.0 in | Wt 180.0 lb

## 2018-05-06 DIAGNOSIS — M25511 Pain in right shoulder: Secondary | ICD-10-CM

## 2018-05-06 MED ORDER — IBUPROFEN-FAMOTIDINE 800-26.6 MG PO TABS: 1.0000 | ORAL_TABLET | Freq: Two times a day (BID) | ORAL | 3 refills | Status: AC

## 2018-05-06 MED ORDER — CYCLOBENZAPRINE HCL 10 MG PO TABS: 5.0000 mg | ORAL_TABLET | Freq: Every evening | ORAL | 0 refills | Status: AC | PRN

## 2018-05-11 ENCOUNTER — Encounter (INDEPENDENT_AMBULATORY_CARE_PROVIDER_SITE_OTHER): Payer: Self-pay | Admitting: Orthopedic Surgery

## 2018-05-11 NOTE — Progress Notes (Signed)
Office Visit Note   Patient: Daniel Branch           Date of Birth: 02-04-1981           MRN: [id number] Visit Date: 05/06/2018 Requested by: Geoffry Paradise, MD [address] [place], [place] [address] PCP: Geoffry Paradise, MD  Subjective: Chief Complaint  Patient presents with  . Right Shoulder - Pain    S/p injection for w/[place] 02/19/18 & 04/21/18    HPI: Daniel Branch is a patient with right shoulder pain.  Had injection with Dr. Ezzard Standing 03/01/2018 and 04/21/2018.  That actually helped him a lot.  Started to have some recurrent mechanical symptoms in the shoulder.  He has had an MRI scan since his stabilization which showed no structural loss of integrity of the repair.  Had a current injury when he was playing with his son about 3 months after index surgery.  He is not had any more instability.  Does have pain at night..  Been taking Tylenol at night.              ROS: All systems reviewed are negative as they relate to the chief complaint within the history of present illness.  Patient denies  fevers or chills.   Assessment & Plan: Visit Diagnoses:  1. Right shoulder pain, unspecified chronicity     Plan: Impression is right shoulder pain following stabilization surgery with postop injury while he was playing with his son.  Examination today is pretty benign.  MRI scan does not show anything definitively operative in the shoulder.  I think is possible he could have some superior labral problems but the repair itself appears intact.  Try Duexis and Flexeril with 73-month return and consideration of repeat evaluation at that time  Follow-Up Instructions: Return in about 4 months (around 09/04/2018).   Orders:  No orders of the defined types were placed in this encounter.  Meds ordered this encounter  Medications  . Ibuprofen-Famotidine (DUEXIS) 800-26.6 MG TABS    Sig: Take 1 tablet by mouth 2 (two) times daily.    Dispense:  60 tablet    Refill:  3  . cyclobenzaprine (FLEXERIL)  10 MG tablet    Sig: Take 0.5 tablets (5 mg total) by mouth at bedtime as needed for muscle spasms.    Dispense:  30 tablet    Refill:  0      Procedures: No procedures performed   Clinical Data: No additional findings.  Objective: Vital Signs: Ht 6\' 3"  (1.905 m)   Wt 180 lb (81.6 kg)   BMI 22.50 kg/m   Physical Exam:   Constitutional: Patient appears well-developed HEENT:  Head: Normocephalic Eyes:EOM are normal Neck: Normal range of motion Cardiovascular: Normal rate Pulmonary/chest: Effort normal Neurologic: Patient is alert Skin: Skin is warm Psychiatric: Patient has normal mood and affect    Ortho Exam: Ortho exam demonstrates full active and passive range of motion of the right shoulder with only a 10 degree difference in external rotation right versus left.  O'Brien's testing equivocal on the right negative on the left.  Negative apprehension testing.  Shoulder stability feels pretty good to anterior drawer testing.  Less than a centimeter sulcus sign on the right and left.  No posterior instability.  Specialty Comments:  No specialty comments available.  Imaging: No results found.   PMFS History: There are no active problems to display for this patient.  Past Medical History:  Diagnosis Date  . ADHD   .  Dental infection    treated with antibiotic  . Insomnia   . PONV (postoperative nausea and vomiting)   . Seasonal allergies     History reviewed. No pertinent family history.  Past Surgical History:  Procedure Laterality Date  . HERNIA REPAIR    . MULTIPLE TOOTH EXTRACTIONS    . SHOULDER ARTHROSCOPY WITH LABRAL REPAIR Right 07/15/2017   Procedure: RIGHT SHOULDER ARTHROSCOPY WITH LABRAL REPAIR;  Surgeon: Cammy Copa, MD;  Location: [facility] OR;  Service: Orthopedics;  Laterality: Right;  . TONSILLECTOMY    . WISDOM TOOTH EXTRACTION     Social History   Occupational History  . Not on file  Tobacco Use  . Smoking status: Never Smoker  .  Smokeless tobacco: Never Used  Substance and Sexual Activity  . Alcohol use: Not Currently  . Drug use: Never  . Sexual activity: Not on file

## 2020-03-11 IMAGING — XA DG FLUORO GUIDE NDL PLC/BX
3 series · 3 of 3 positions shown · IV contrast (multihance)
Comparison: none

CLINICAL DATA: Right shoulder pain.  Prior surgery.

EXAM:
RIGHT SHOULDER INJECTION UNDER FLUOROSCOPY
TECHNIQUE: An appropriate skin entrance site was determined. The site was
marked, prepped with Betadine, draped in the usual sterile fashion,
and infiltrated locally with buffered Lidocaine. A 22 gauge spinal
needle was advanced to the superomedial margin of the humeral head
under intermittent fluoroscopy. 1 mL of 1% lidocaine injected
easily. A mixture of 0.1 mL of MultiHance, 10 mL of Isovue-M 200,
and 10 mL of sterile saline was then used to opacify the right
shoulder capsule. 12 mL of this mixture were injected. No immediate
complication.
FLUOROSCOPY TIME:  Fluoroscopy Time:  4 seconds
Radiation Exposure Index (if provided by the fluoroscopic device):
6.80 microGray*m^2
Number of Acquired Spot Images: 0

[Series 1: ortho standard · 1 of 1 slices shown (1 of 3)]
[im 1/1]
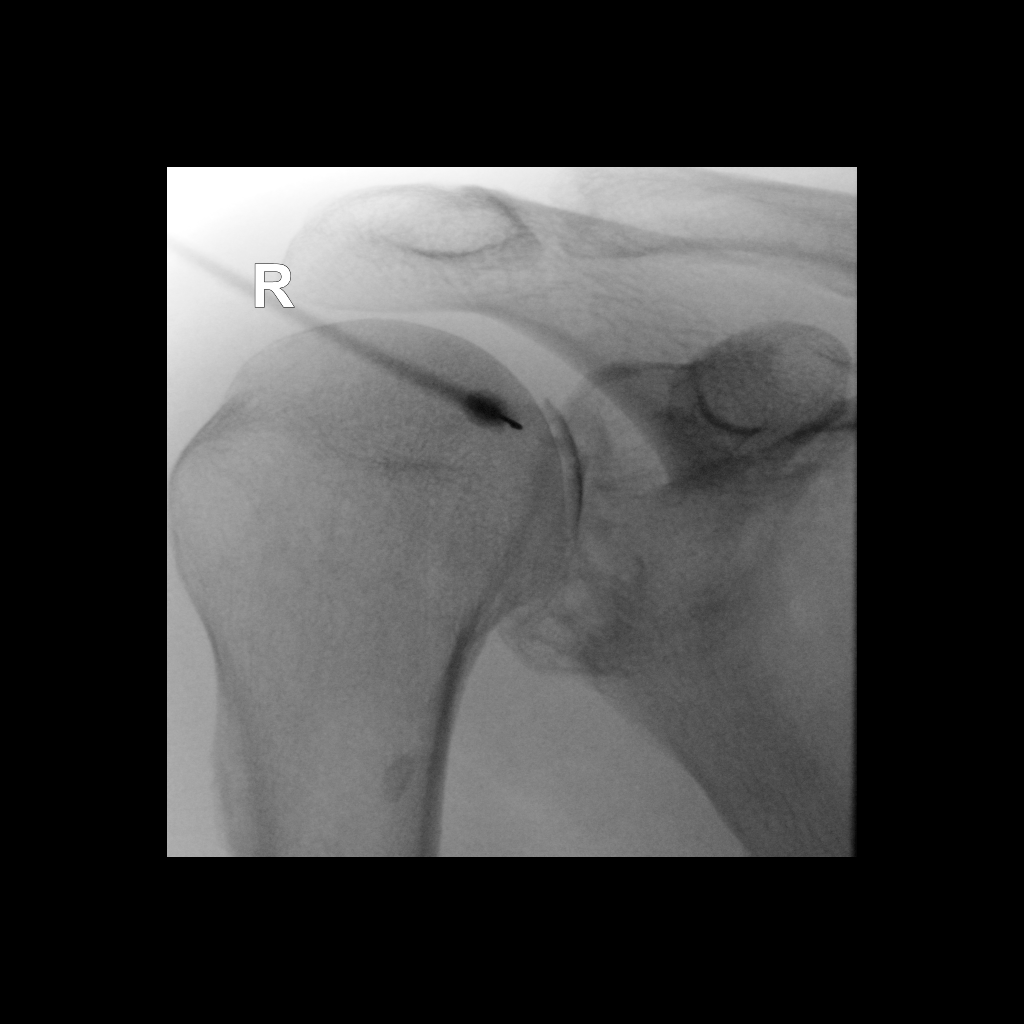

[Series 2: ortho standard · 1 of 1 slices shown (2 of 3)]
[im 1/1]
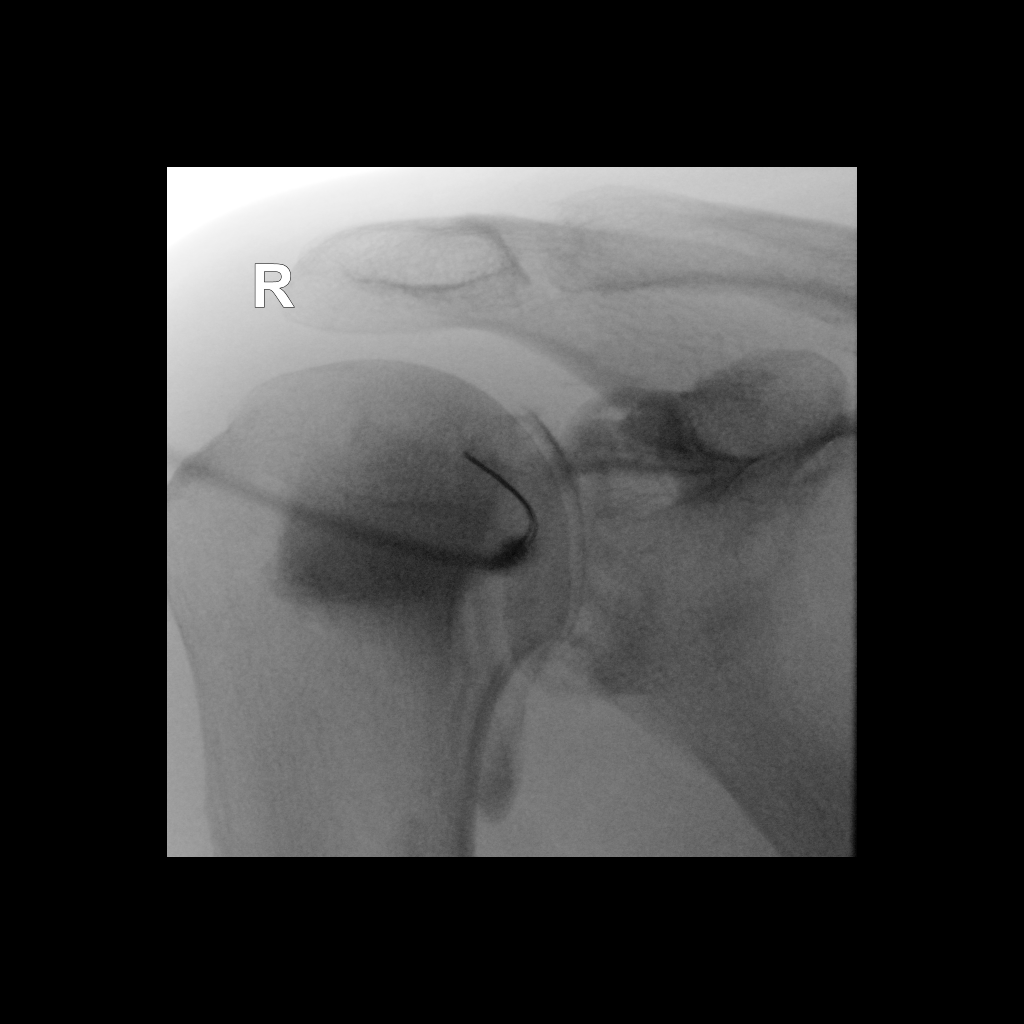

[Series 3: ortho standard · 1 of 1 slices shown (3 of 3)]
[im 1/1]
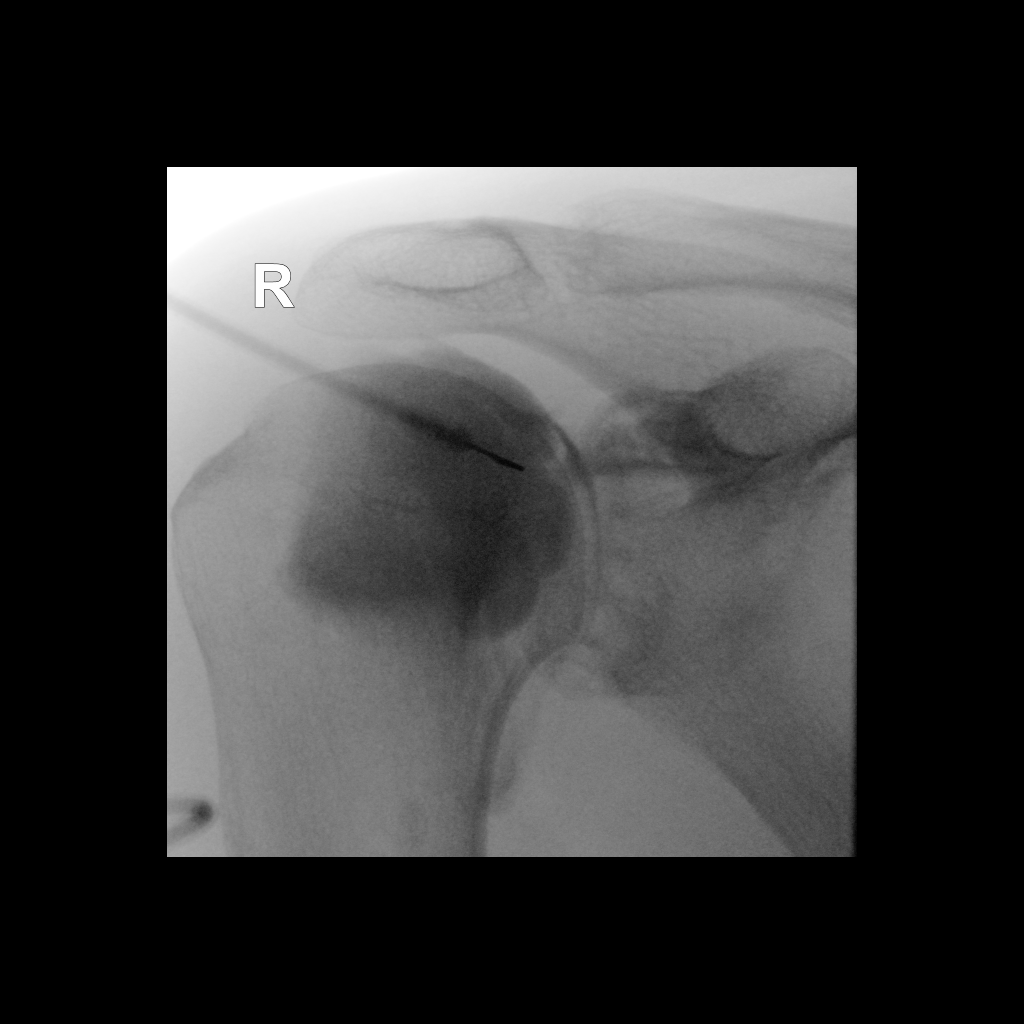

[3 of 3 positions shown; findings below may reference images not displayed]

IMPRESSION: Technically successful right shoulder injection for MRI.

## 2021-08-29 ENCOUNTER — Inpatient Hospital Stay: Admit: 2021-08-29 | Payer: BLUE CROSS/BLUE SHIELD | Primary: Specialist

## 2021-08-29 DIAGNOSIS — G4733 Obstructive sleep apnea (adult) (pediatric): Secondary | ICD-10-CM

## 2021-12-05 NOTE — Telephone Encounter (Signed)
Formatting of this note might be different from the original.  Returned pt all re: scheduled for hand surgery next week. Surgeon asked him to contact office for clearance to hold Eliquis for 1-2 days prior.    Pt is s/p afib ablation on 7/13.    LM for pt-- asked to have hand surgeon fax clearance request to Dr. Dede Query at [place] office.  Made aware that typically request 8-10 weeks of uninterrupted OAC post ablation but will defer to Dr. Dede Query for recommendations  Electronically signed by Ihor Gully., LPN at [id number]/93/7902  3:52 PM EDT

## 2021-12-06 NOTE — Telephone Encounter (Signed)
Formatting of this note might be different from the original.  Images from the original note were not included.  Pt notified of Dr. Carvel Getting response,    Crisoforo Oxford, MD  Ihor Gully., LPN  Caller: Unspecified Burgess Estelle,  3:49 PM)  Shanon Rosser stop DOAC for at least 3 months following AFIB ablation.   Electronically signed by Ihor Gully., LPN at [id number]/41/9622  7:20 PM EDT

## 2022-02-22 DIAGNOSIS — G4761 Periodic limb movement disorder: Secondary | ICD-10-CM

## 2022-02-23 ENCOUNTER — Ambulatory Visit: Payer: BLUE CROSS/BLUE SHIELD | Primary: Specialist
